# Patient Record
Sex: Male | Born: 1960 | Race: Black or African American | Hispanic: No | Marital: Single | Smoking: Never smoker
Health system: Southern US, Community
[De-identification: ages and names within clinical notes are randomized; demographics above are authoritative.]

## PROBLEM LIST (undated history)

## (undated) HISTORY — PX: CYSTECTOMY: SUR359

---

## 2008-09-08 LAB — HM COLONOSCOPY: HM Colonoscopy: NORMAL

## 2011-05-15 ENCOUNTER — Emergency Department (INDEPENDENT_AMBULATORY_CARE_PROVIDER_SITE_OTHER): Payer: No Typology Code available for payment source

## 2011-05-15 ENCOUNTER — Emergency Department (HOSPITAL_BASED_OUTPATIENT_CLINIC_OR_DEPARTMENT_OTHER)
Admission: EM | Admit: 2011-05-15 | Discharge: 2011-05-15 | Disposition: A | Discharge status: Home / Self Care | Payer: No Typology Code available for payment source | Attending: Emergency Medicine | Admitting: Emergency Medicine

## 2011-05-15 ENCOUNTER — Encounter: Payer: Self-pay | Admitting: Family Medicine

## 2011-05-15 DIAGNOSIS — M5137 Other intervertebral disc degeneration, lumbosacral region: Secondary | ICD-10-CM

## 2011-05-15 DIAGNOSIS — Y92410 Unspecified street and highway as the place of occurrence of the external cause: Secondary | ICD-10-CM | POA: Insufficient documentation

## 2011-05-15 DIAGNOSIS — S025XXA Fracture of tooth (traumatic), initial encounter for closed fracture: Secondary | ICD-10-CM

## 2011-05-15 DIAGNOSIS — M549 Dorsalgia, unspecified: Secondary | ICD-10-CM

## 2011-05-15 DIAGNOSIS — M47812 Spondylosis without myelopathy or radiculopathy, cervical region: Secondary | ICD-10-CM

## 2011-05-15 DIAGNOSIS — M542 Cervicalgia: Secondary | ICD-10-CM

## 2011-05-15 DIAGNOSIS — M51379 Other intervertebral disc degeneration, lumbosacral region without mention of lumbar back pain or lower extremity pain: Secondary | ICD-10-CM | POA: Insufficient documentation

## 2011-05-15 MED ORDER — HYDROCODONE-ACETAMINOPHEN 5-500 MG PO TABS: 1.0000 | ORAL_TABLET | Freq: Four times a day (QID) | ORAL | Status: AC | PRN

## 2011-05-15 MED ORDER — IBUPROFEN 600 MG PO TABS
600.0000 mg | ORAL_TABLET | Freq: Four times a day (QID) | ORAL | Status: AC | PRN
Start: 2011-05-15 — End: 2011-05-25

## 2011-05-15 MED ORDER — IBUPROFEN 800 MG PO TABS
800.0000 mg | ORAL_TABLET | Freq: Once | ORAL | Status: AC
  Administered 2011-05-15: 800 mg via ORAL
  Filled 2011-05-15: qty 1

## 2011-05-15 MED ORDER — CYCLOBENZAPRINE HCL 10 MG PO TABS: 10.0000 mg | ORAL_TABLET | Freq: Every evening | ORAL | Status: AC | PRN
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
## 2011-05-28 ENCOUNTER — Emergency Department (HOSPITAL_BASED_OUTPATIENT_CLINIC_OR_DEPARTMENT_OTHER)
Admission: EM | Admit: 2011-05-28 | Discharge: 2011-05-28 | Disposition: A | Discharge status: Home / Self Care | Payer: No Typology Code available for payment source | Attending: Emergency Medicine | Admitting: Emergency Medicine

## 2011-05-28 ENCOUNTER — Emergency Department (INDEPENDENT_AMBULATORY_CARE_PROVIDER_SITE_OTHER): Payer: No Typology Code available for payment source

## 2011-05-28 ENCOUNTER — Encounter (HOSPITAL_BASED_OUTPATIENT_CLINIC_OR_DEPARTMENT_OTHER): Payer: Self-pay | Admitting: Emergency Medicine

## 2011-05-28 DIAGNOSIS — M549 Dorsalgia, unspecified: Secondary | ICD-10-CM

## 2011-05-28 DIAGNOSIS — M542 Cervicalgia: Secondary | ICD-10-CM | POA: Insufficient documentation

## 2011-05-28 DIAGNOSIS — K029 Dental caries, unspecified: Secondary | ICD-10-CM | POA: Insufficient documentation

## 2011-05-28 DIAGNOSIS — M545 Low back pain, unspecified: Secondary | ICD-10-CM | POA: Insufficient documentation

## 2011-05-28 DIAGNOSIS — IMO0001 Reserved for inherently not codable concepts without codable children: Secondary | ICD-10-CM | POA: Insufficient documentation

## 2011-05-28 DIAGNOSIS — M7918 Myalgia, other site: Secondary | ICD-10-CM

## 2011-05-28 MED ORDER — HYDROCODONE-ACETAMINOPHEN 5-325 MG PO TABS: 2.0000 | ORAL_TABLET | Freq: Four times a day (QID) | ORAL | Status: AC | PRN

## 2011-05-28 MED ORDER — CYCLOBENZAPRINE HCL 10 MG PO TABS: 10.0000 mg | ORAL_TABLET | Freq: Three times a day (TID) | ORAL | Status: AC | PRN

## 2011-05-28 MED ORDER — IBUPROFEN 800 MG PO TABS
800.0000 mg | ORAL_TABLET | Freq: Once | ORAL | Status: AC
  Administered 2011-05-28: 800 mg via ORAL
  Filled 2011-05-28: qty 1
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
## 2011-06-11 ENCOUNTER — Encounter: Payer: Self-pay | Admitting: Internal Medicine

## 2011-06-11 ENCOUNTER — Ambulatory Visit (INDEPENDENT_AMBULATORY_CARE_PROVIDER_SITE_OTHER): Payer: Self-pay | Admitting: Internal Medicine

## 2011-06-11 VITALS — BP 122/80 | HR 73 | Temp 98.0°F | Resp 20 | Ht 75.0 in | Wt 205.0 lb

## 2011-06-11 DIAGNOSIS — M542 Cervicalgia: Secondary | ICD-10-CM

## 2011-06-11 DIAGNOSIS — G44209 Tension-type headache, unspecified, not intractable: Secondary | ICD-10-CM

## 2011-06-11 DIAGNOSIS — M549 Dorsalgia, unspecified: Secondary | ICD-10-CM

## 2011-06-11 MED ORDER — PREDNISONE 10 MG PO TABS: ORAL_TABLET | ORAL | Status: DC

## 2011-06-11 MED ORDER — CYCLOBENZAPRINE HCL 10 MG PO TABS: 10.0000 mg | ORAL_TABLET | Freq: Three times a day (TID) | ORAL | Status: DC | PRN

## 2011-06-13 DIAGNOSIS — G44209 Tension-type headache, unspecified, not intractable: Secondary | ICD-10-CM | POA: Insufficient documentation

## 2011-06-13 DIAGNOSIS — M542 Cervicalgia: Secondary | ICD-10-CM | POA: Insufficient documentation

## 2011-06-13 DIAGNOSIS — M549 Dorsalgia, unspecified: Secondary | ICD-10-CM | POA: Insufficient documentation

## 2011-06-13 NOTE — Assessment & Plan Note (Signed)
Attempt prednisone taper. PT as above

## 2011-06-13 NOTE — Assessment & Plan Note (Signed)
Attempt ibuprofen with food and no other nsaids. rf flexeril and cautioned re possible sedating effect. Wean off vicodin. Schedule PT
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
## 2011-06-24 ENCOUNTER — Ambulatory Visit: Payer: No Typology Code available for payment source | Attending: Internal Medicine | Admitting: Physical Therapy

## 2011-06-24 DIAGNOSIS — IMO0001 Reserved for inherently not codable concepts without codable children: Secondary | ICD-10-CM | POA: Insufficient documentation

## 2011-06-24 DIAGNOSIS — M542 Cervicalgia: Secondary | ICD-10-CM | POA: Insufficient documentation

## 2011-06-24 DIAGNOSIS — M545 Low back pain, unspecified: Secondary | ICD-10-CM | POA: Insufficient documentation

## 2011-07-03 ENCOUNTER — Ambulatory Visit: Payer: No Typology Code available for payment source | Attending: Internal Medicine | Admitting: Physical Therapy

## 2011-07-03 DIAGNOSIS — M542 Cervicalgia: Secondary | ICD-10-CM | POA: Insufficient documentation

## 2011-07-03 DIAGNOSIS — M545 Low back pain, unspecified: Secondary | ICD-10-CM | POA: Insufficient documentation

## 2011-07-03 DIAGNOSIS — IMO0001 Reserved for inherently not codable concepts without codable children: Secondary | ICD-10-CM | POA: Insufficient documentation

## 2011-07-17 ENCOUNTER — Ambulatory Visit: Payer: No Typology Code available for payment source | Admitting: Physical Therapy

## 2011-07-20 ENCOUNTER — Encounter: Payer: Self-pay | Admitting: Internal Medicine

## 2011-07-20 ENCOUNTER — Ambulatory Visit (INDEPENDENT_AMBULATORY_CARE_PROVIDER_SITE_OTHER): Payer: No Typology Code available for payment source | Admitting: Internal Medicine

## 2011-07-20 DIAGNOSIS — M549 Dorsalgia, unspecified: Secondary | ICD-10-CM

## 2011-07-20 DIAGNOSIS — M542 Cervicalgia: Secondary | ICD-10-CM

## 2011-07-20 MED ORDER — PREDNISONE 10 MG PO TABS: ORAL_TABLET | ORAL | Status: AC

## 2011-07-25 NOTE — Assessment & Plan Note (Signed)
reattempt prednisone taper. Consider ls mri if sx's persist.
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
## 2011-07-25 NOTE — Assessment & Plan Note (Signed)
Improving. Continue pt.

## 2011-07-31 ENCOUNTER — Ambulatory Visit: Payer: No Typology Code available for payment source | Admitting: Physical Therapy

## 2011-08-14 ENCOUNTER — Ambulatory Visit: Payer: No Typology Code available for payment source | Attending: Internal Medicine | Admitting: Physical Therapy

## 2011-08-14 DIAGNOSIS — M542 Cervicalgia: Secondary | ICD-10-CM | POA: Insufficient documentation

## 2011-08-14 DIAGNOSIS — IMO0001 Reserved for inherently not codable concepts without codable children: Secondary | ICD-10-CM | POA: Insufficient documentation

## 2011-08-14 DIAGNOSIS — M545 Low back pain, unspecified: Secondary | ICD-10-CM | POA: Insufficient documentation

## 2011-08-31 ENCOUNTER — Encounter: Payer: Self-pay | Admitting: Internal Medicine

## 2011-08-31 ENCOUNTER — Ambulatory Visit (INDEPENDENT_AMBULATORY_CARE_PROVIDER_SITE_OTHER): Payer: No Typology Code available for payment source | Admitting: Internal Medicine

## 2011-08-31 VITALS — BP 132/80 | HR 62 | Temp 97.9°F | Resp 18 | Ht 75.0 in | Wt 199.0 lb

## 2011-08-31 DIAGNOSIS — M549 Dorsalgia, unspecified: Secondary | ICD-10-CM

## 2011-08-31 MED ORDER — CYCLOBENZAPRINE HCL 10 MG PO TABS: 10.0000 mg | ORAL_TABLET | Freq: Three times a day (TID) | ORAL | Status: DC | PRN

## 2011-09-04 NOTE — Assessment & Plan Note (Signed)
Neurologically nonfocal. Continue PT. Rf flexeril for prn use. Discussed LS MRI for further evaluation and pt currently defers. Schedule f/u for re-evaluation.
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
## 2011-09-21 ENCOUNTER — Ambulatory Visit: Payer: No Typology Code available for payment source | Attending: Internal Medicine | Admitting: Physical Therapy

## 2011-09-21 DIAGNOSIS — M545 Low back pain, unspecified: Secondary | ICD-10-CM | POA: Insufficient documentation

## 2011-09-21 DIAGNOSIS — IMO0001 Reserved for inherently not codable concepts without codable children: Secondary | ICD-10-CM | POA: Insufficient documentation

## 2011-09-21 DIAGNOSIS — M542 Cervicalgia: Secondary | ICD-10-CM | POA: Insufficient documentation

## 2011-09-25 ENCOUNTER — Ambulatory Visit: Payer: No Typology Code available for payment source | Attending: Internal Medicine | Admitting: Physical Therapy

## 2011-09-25 DIAGNOSIS — IMO0001 Reserved for inherently not codable concepts without codable children: Secondary | ICD-10-CM | POA: Insufficient documentation

## 2011-09-25 DIAGNOSIS — M545 Low back pain, unspecified: Secondary | ICD-10-CM | POA: Insufficient documentation

## 2011-09-25 DIAGNOSIS — M542 Cervicalgia: Secondary | ICD-10-CM | POA: Insufficient documentation

## 2011-09-28 ENCOUNTER — Encounter: Payer: No Typology Code available for payment source | Admitting: Physical Therapy

## 2011-10-05 ENCOUNTER — Encounter: Payer: No Typology Code available for payment source | Admitting: Physical Therapy

## 2011-10-09 ENCOUNTER — Encounter: Payer: No Typology Code available for payment source | Admitting: Physical Therapy

## 2011-10-12 ENCOUNTER — Encounter: Payer: No Typology Code available for payment source | Admitting: Physical Therapy

## 2011-10-16 ENCOUNTER — Ambulatory Visit (INDEPENDENT_AMBULATORY_CARE_PROVIDER_SITE_OTHER): Payer: No Typology Code available for payment source | Admitting: Internal Medicine

## 2011-10-16 ENCOUNTER — Encounter: Payer: Self-pay | Admitting: Internal Medicine

## 2011-10-16 VITALS — BP 124/80 | HR 68 | Temp 98.1°F | Resp 18 | Wt 198.0 lb

## 2011-10-16 DIAGNOSIS — M549 Dorsalgia, unspecified: Secondary | ICD-10-CM

## 2011-10-16 MED ORDER — CYCLOBENZAPRINE HCL 10 MG PO TABS: 10.0000 mg | ORAL_TABLET | Freq: Three times a day (TID) | ORAL | Status: DC | PRN

## 2011-10-21 NOTE — Assessment & Plan Note (Signed)
Failing conservative care. RF flexeril for prn use. Recommend LS MRI for further evaluation. States will consider. Advised to seek immediate medical attention if develops focal weakness and/or incontinence.

## 2011-10-21 NOTE — Progress Notes (Signed)
  Subjective:    Patient ID: Edward Higgins, male    DOB: 1961-03-22, 51 y.o.   MRN: [id number]  HPI Pt presents to clinic for followup of multiple medical problems. Has noted no further improvement of low back pain. Continues to notes radicular pain down left leg without paresthesia or weakness. S/p steroid taper x 2 and PT. States flexeril helps some. No other alleviating factors.   No past medical history on file. Past Surgical History  Procedure Date  . Cystectomy     mid back    reports that he has never smoked. He has never used smokeless tobacco. He reports that he does not drink alcohol or use illicit drugs. family history is negative for Breast cancer, and Prostate cancer, and Hypertension, and Heart disease, and Diabetes, and Colon cancer, . No Known Allergies    Review of Systems see hpi     Objective:   Physical Exam  Nursing note and vitals reviewed. Constitutional: He appears well-developed and well-nourished. No distress.  HENT:  Head: Normocephalic and atraumatic.  Musculoskeletal:       Bilateral LE strength 5/5. Gait nl.  Neurological: He is alert.  Skin: Skin is warm and dry. He is not diaphoretic.  Psychiatric: He has a normal mood and affect.          Assessment & Plan:

## 2011-10-23 ENCOUNTER — Encounter: Payer: No Typology Code available for payment source | Admitting: Physical Therapy

## 2011-12-18 ENCOUNTER — Ambulatory Visit: Payer: No Typology Code available for payment source | Admitting: Internal Medicine

## 2012-01-22 ENCOUNTER — Ambulatory Visit: Payer: No Typology Code available for payment source | Admitting: Internal Medicine

## 2012-01-29 ENCOUNTER — Ambulatory Visit: Payer: Self-pay | Admitting: Internal Medicine

## 2012-01-29 DIAGNOSIS — Z0289 Encounter for other administrative examinations: Secondary | ICD-10-CM

## 2015-04-15 ENCOUNTER — Emergency Department (HOSPITAL_BASED_OUTPATIENT_CLINIC_OR_DEPARTMENT_OTHER)
Admission: EM | Admit: 2015-04-15 | Discharge: 2015-04-15 | Disposition: A | Discharge status: Home / Self Care | Payer: No Typology Code available for payment source | Attending: Emergency Medicine | Admitting: Emergency Medicine

## 2015-04-15 ENCOUNTER — Encounter (HOSPITAL_BASED_OUTPATIENT_CLINIC_OR_DEPARTMENT_OTHER): Payer: Self-pay | Admitting: Emergency Medicine

## 2015-04-15 DIAGNOSIS — S29002A Unspecified injury of muscle and tendon of back wall of thorax, initial encounter: Secondary | ICD-10-CM | POA: Insufficient documentation

## 2015-04-15 DIAGNOSIS — Y998 Other external cause status: Secondary | ICD-10-CM | POA: Diagnosis not present

## 2015-04-15 DIAGNOSIS — Y92410 Unspecified street and highway as the place of occurrence of the external cause: Secondary | ICD-10-CM | POA: Diagnosis not present

## 2015-04-15 DIAGNOSIS — Y9389 Activity, other specified: Secondary | ICD-10-CM | POA: Insufficient documentation

## 2015-04-15 DIAGNOSIS — M62838 Other muscle spasm: Secondary | ICD-10-CM | POA: Insufficient documentation

## 2015-04-15 DIAGNOSIS — S199XXA Unspecified injury of neck, initial encounter: Secondary | ICD-10-CM | POA: Diagnosis present

## 2015-04-15 MED ORDER — DIAZEPAM 5 MG PO TABS: 5.0000 mg | ORAL_TABLET | Freq: Four times a day (QID) | ORAL | Status: DC | PRN

## 2015-04-15 MED ORDER — DIAZEPAM 5 MG PO TABS: 5.0000 mg | ORAL_TABLET | Freq: Once | ORAL | Status: DC

## 2015-04-15 MED ORDER — MELOXICAM 7.5 MG PO TABS: 7.5000 mg | ORAL_TABLET | Freq: Every day | ORAL | Status: DC
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
## 2015-04-16 ENCOUNTER — Ambulatory Visit (HOSPITAL_BASED_OUTPATIENT_CLINIC_OR_DEPARTMENT_OTHER)
Admission: RE | Admit: 2015-04-16 | Discharge: 2015-04-16 | Disposition: A | Discharge status: Home / Self Care | Payer: No Typology Code available for payment source | Source: Ambulatory Visit | Attending: Family Medicine | Admitting: Family Medicine

## 2015-04-16 ENCOUNTER — Encounter: Payer: Self-pay | Admitting: Family Medicine

## 2015-04-16 ENCOUNTER — Ambulatory Visit (INDEPENDENT_AMBULATORY_CARE_PROVIDER_SITE_OTHER): Payer: Self-pay | Admitting: Family Medicine

## 2015-04-16 VITALS — BP 126/84 | HR 68 | Ht 74.0 in | Wt 185.0 lb

## 2015-04-16 DIAGNOSIS — M542 Cervicalgia: Secondary | ICD-10-CM | POA: Insufficient documentation

## 2015-04-16 DIAGNOSIS — S199XXA Unspecified injury of neck, initial encounter: Secondary | ICD-10-CM

## 2015-04-16 MED ORDER — PREDNISONE 10 MG PO TABS: ORAL_TABLET | ORAL | Status: DC

## 2015-04-16 MED ORDER — HYDROCODONE-ACETAMINOPHEN 5-325 MG PO TABS: 1.0000 | ORAL_TABLET | Freq: Four times a day (QID) | ORAL | Status: DC | PRN

## 2015-04-16 MED ORDER — DIAZEPAM 5 MG PO TABS: 5.0000 mg | ORAL_TABLET | Freq: Three times a day (TID) | ORAL | Status: DC | PRN

## 2015-04-16 NOTE — Patient Instructions (Signed)
You have cervical radiculopathy (a pinched nerve in the neck). We will go ahead with an MRI given your weakness, severe pain on the right side to look for a disc herniation. Prednisone 6 day dose pack to relieve irritation/inflammation of the nerve. Aleve 2 tabs twice a day with food for pain and inflammation - start day AFTER finishing prednisone if prescribed this. Valium three times a day as needed for muscle spasms (no driving while taking this). Norco as needed for severe pain (no driving on this medicine). Consider cervical collar if severely painful. Simple range of motion exercises within limits of pain to prevent further stiffness. Consider physical therapy for stretching, exercises, traction, and modalities in the future. Heat 15 minutes at a time 3-4 times a day to help with spasms. Watch head position when on computers, texting, when sleeping in bed - should in line with back to prevent further nerve traction and irritation. I will call you with MRI results and next steps.

## 2015-04-22 ENCOUNTER — Ambulatory Visit (INDEPENDENT_AMBULATORY_CARE_PROVIDER_SITE_OTHER): Payer: Self-pay

## 2015-04-22 ENCOUNTER — Telehealth: Payer: Self-pay | Admitting: Family Medicine

## 2015-04-22 ENCOUNTER — Ambulatory Visit: Payer: Self-pay | Admitting: Family Medicine

## 2015-04-22 DIAGNOSIS — S199XXA Unspecified injury of neck, initial encounter: Secondary | ICD-10-CM

## 2015-04-22 DIAGNOSIS — M47892 Other spondylosis, cervical region: Secondary | ICD-10-CM

## 2015-04-22 NOTE — Telephone Encounter (Signed)
Patient was given instructions regarding valium prior to MRI - is going to reattempt today.

## 2015-04-23 DIAGNOSIS — S199XXA Unspecified injury of neck, initial encounter: Secondary | ICD-10-CM | POA: Insufficient documentation
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
## 2015-04-24 NOTE — Addendum Note (Signed)
Addended by: Kathi SimpersWISE, Naaman Curro F on: 04/24/2015 10:42 AM   Modules accepted: Orders

## 2015-04-25 ENCOUNTER — Ambulatory Visit: Payer: No Typology Code available for payment source | Attending: Family Medicine | Admitting: Physical Therapy

## 2015-04-25 DIAGNOSIS — M436 Torticollis: Secondary | ICD-10-CM | POA: Diagnosis present

## 2015-04-25 DIAGNOSIS — M542 Cervicalgia: Secondary | ICD-10-CM | POA: Insufficient documentation
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
## 2015-04-26 ENCOUNTER — Ambulatory Visit: Payer: No Typology Code available for payment source | Admitting: Physical Therapy

## 2015-04-26 DIAGNOSIS — M542 Cervicalgia: Secondary | ICD-10-CM

## 2015-04-26 DIAGNOSIS — M436 Torticollis: Secondary | ICD-10-CM

## 2015-04-26 NOTE — Therapy (Signed)
[facility] [address]  [address] [place], [place], [address] Phone: [phone]   Fax:  [phone]  Physical Therapy Treatment  Patient Details  Name: Edward Higgins MRN: [id number] Date of Birth: Nov 29, 1960 Referring Provider: Lenda KelpShane R Hudnall, MD  Encounter Date: 04/26/2015      PT End of Session - 04/26/15 1138    Visit Number 2   Number of Visits 12   Date for PT Re-Evaluation 06/06/15   PT Start Time 0845   PT Stop Time 0940   PT Time Calculation (min) 55 min   Activity Tolerance Patient limited by pain   Behavior During Therapy Restless      No past medical history on file.  Past Surgical History  Procedure Laterality Date   Cystectomy      mid back    There were no vitals filed for this visit.  Visit Diagnosis:  Neck pain  Neck stiffness      Subjective Assessment - 04/26/15 1133    Subjective Cont with significant Right> Left neck and shoulder pain and stiffness.  Difficulty sleeping d/t intolerance to positioning.     Currently in Pain? Yes   Pain Score 6    Pain Location Neck   Pain Orientation Right;Lateral   Pain Descriptors / Indicators Tightness;Throbbing   Pain Type Acute pain   Pain Radiating Towards neck/ upper shoulder and upper arm            [facility] PT Assessment - 04/25/15 1020    Assessment   Medical Diagnosis Right neck/shoulder pain   Referring Provider Lenda KelpShane R Hudnall, MD   Onset Date/Surgical Date 04/13/15   Next MD Visit ~2 weeks from 11/29   Prior Therapy none   Balance Screen   Has the patient fallen in the past 6 months No   Has the patient had a decrease in activity level because of a fear of falling?  No   Is the patient reluctant to leave their home because of a fear of falling?  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   Observation/Other Assessments   Focus  on Therapeutic Outcomes (FOTO)  Neck 32% (68% limitation); Predicted 63% (37% limitation)   Posture/Postural Control   Posture/Postural Control Postural limitations   Postural Limitations Rounded Shoulders;Forward head   Posture Comments Slouched posture with head held stiffly in left lateral tilt   ROM / Strength   AROM / PROM / Strength AROM;Strength   AROM   Overall AROM Comments Pain with all attempted movement of neck/shoulders   AROM Assessment Site Cervical;Shoulder   Right/Left Shoulder Right;Left   Right Shoulder Flexion 72 Degrees   Right Shoulder ABduction --  unable   Left Shoulder Flexion 88 Degrees   Left Shoulder ABduction 92 Degrees   Cervical Flexion unable   Cervical Extension unable   Cervical - Right Side Bend unable   Cervical - Left Side Bend unable   Cervical - Right Rotation 19   Cervical - Left Rotation 16   Palpation   Palpation comment very ttp along bilateral paraspinals R>L, Rt upper trap, levator and scalenes                     [facility] Adult PT Treatment/Exercise - 04/26/15 0001    Exercises   Exercises Neck;Shoulder   Neck Exercises: Seated  Cervical Rotation Both;5 reps   Cervical Rotation Limitations minimal ROM with increased pain bilaterally   Lateral Flexion Both;5 reps   Lateral Flexion Limitations minimal ROM with increased pain bilaterally   Modalities   Modalities Ultrasound;Moist Heat;Electrical Stimulation   Moist Heat Therapy   Number Minutes Moist Heat 15 Minutes   Moist Heat Location Cervical;Shoulder   Electrical Stimulation   Electrical Stimulation Location Right C/S upper trap   Electrical Stimulation Parameters premod 80-150Hz     Electrical Stimulation Goals Pain   Ultrasound   Ultrasound Location Right C/S, upper trap   Ultrasound Parameters 100% @ 1.2w/cm2   Ultrasound Goals Pain   Manual Therapy   Manual Therapy Soft tissue mobilization;Passive ROM   Manual therapy comments hyper tonic throughout.   TTP throughout   Soft tissue mobilization Unable to assume supine d/t increased guarding and pain   Passive ROM minimal C/S ROM throughout                PT Education - 04/25/15 2005    Education provided Yes   Education Details Pain management - estim   Person(s) Educated Patient   Methods Explanation;Demonstration   Comprehension Verbalized understanding          PT Short Term Goals - 04/25/15 2028    PT SHORT TERM GOAL #1   Title Pt independent with initial HEP (05/09/15)   Time 2   Period Weeks   Status New           PT Long Term Goals - 04/25/15 2028    PT LONG TERM GOAL #1   Title Pt independent with advanced HEP vs gym program (06/06/15)   Time 6   Period Weeks   Status New   PT LONG TERM GOAL #2   Title Pt will demonstrate C-spine B rotation to 50 degrees or greater, B side-bending to 30 degrees, Flexion & Extension to 50 degrees or greater (06/06/15)   Time 6   Period Weeks   Status New   PT LONG TERM GOAL #3   Title Pt will demonstrate bilateral shoulder ROM WFL (06/06/15)   Time 6   Period Weeks   Status New   PT LONG TERM GOAL #4   Title Pt reports able to sleep without restriction by neck pain (06/06/15)   Time 6   Period Weeks   Status New   PT LONG TERM GOAL #5   Title Pt reports able to perform all chores, ADLs, and work/volunteer duties without restriction by neck pain (06/06/15)   Time 6   Period Weeks   Status New               Plan - 04/26/15 1139    Clinical Impression Statement Poor tolerance in general d/t hyper tonic musculature throughout Right C/S and upper trap.  Significant ROM limitations d/t guarding and pain.  Multiple areas of tigger point throughout Right upper trap, levator scap, and C/S.  Did not report any N/T.  Reports feeling benefit from electric stimulation with MHP.  Only tolerated very minimal intensity with Electric stimulation.  Did encourage him to purchase a soft collar as this may help to decreased the  amount of muscle strain he is achieving d/t guarding and forward head posture.     Pt will benefit from skilled therapeutic intervention in order to improve on the following deficits Pain;Hypomobility;Impaired flexibility;Decreased range of motion;Decreased strength;Postural dysfunction   Rehab Potential Good   PT Next Visit Plan manual  and modalities PRN for pain and ROM, ? traction, postural education and training        Problem List Patient Active Problem List   Diagnosis Date Noted   Neck injury 04/23/2015   Neck pain 06/13/2011   Back pain 06/13/2011   Tension headache 06/13/2011    Tomie China, PTA 04/26/2015, 11:45 AM  [facility] [address]  [address] [place], [place], [address] Phone: [phone]   Fax:  [phone]  Name: Edward Higgins MRN: [id number] Date of Birth: [birth year]

## 2015-04-26 NOTE — Addendum Note (Signed)
Addended by: Lenda KelpHUDNALL, Alisha Bacus R on: 04/26/2015 08:31 AM   Modules accepted: Kipp BroodSmartSet

## 2015-05-03 ENCOUNTER — Ambulatory Visit: Payer: No Typology Code available for payment source | Admitting: Physical Therapy

## 2015-05-03 DIAGNOSIS — M436 Torticollis: Secondary | ICD-10-CM

## 2015-05-03 DIAGNOSIS — M542 Cervicalgia: Secondary | ICD-10-CM | POA: Diagnosis not present

## 2015-05-03 NOTE — Therapy (Signed)
[facility] [address]  [address] [place], [place], [address] Phone: [phone]   Fax:  [phone]  Physical Therapy Treatment  Patient Details  Name: Edward Higgins MRN: [id number] Date of Birth: 03-23-1961 Referring Provider: Lenda Kelp, MD  Encounter Date: 05/03/2015      PT End of Session - 05/03/15 0938    Visit Number 3   Number of Visits 12   Date for PT Re-Evaluation 06/06/15   PT Start Time 0850   PT Stop Time 0945   PT Time Calculation (min) 55 min   Activity Tolerance Patient tolerated treatment well   Behavior During Therapy Restless;Anxious      No past medical history on file.  Past Surgical History  Procedure Laterality Date   Cystectomy      mid back    There were no vitals filed for this visit.  Visit Diagnosis:  Neck pain  Neck stiffness      Subjective Assessment - 05/03/15 0933    Subjective Reports ongoing C/S pain, tightness, and decreased ROM.  Reports that he has been using a soft collar as encouraged last visit.  States that he cont to have "nerve pain:" Right C/S and upper trap.  Sleeping poorly d/t increased pain.  Fearful of pain with ROM.  States that this morning he is feeling particularly bad, and does not want to have any soft tissue or PROM work done.  Reperts that heat has been making him feel better, and really just wants heat and E-stim today.     Currently in Pain? Yes   Pain Score 6    Pain Location Neck   Pain Orientation Right;Lateral   Pain Descriptors / Indicators Tightness;Throbbing   Pain Type Acute pain   Pain Frequency Constant   Pain Relieving Factors heat                         [facility] Adult PT Treatment/Exercise - 05/03/15 0001    Exercises   Exercises --   Neck Exercises: Standing   Neck Retraction 10 reps;3 secs   Neck Retraction Limitations in sitting.  limited ROM d/t pain   Neck Exercises: Seated   Neck Retraction 10  reps;3 secs   Neck Retraction Limitations minimal ROM d/t increased pain and guarding   Cervical Rotation Both;10 reps   Cervical Rotation Limitations minimal ROM with increased pain bilaterally   Lateral Flexion Both;10 reps   Lateral Flexion Limitations minimal ROM with increased pain bilaterally   Other Seated Exercise C/S extension/ Flexion 10x5" ea   Other Seated Exercise T/ rotation 10x5" ea B   Shoulder Exercises: Seated   Retraction AROM;Both;10 reps   Other Seated Exercises Shoulder shrugs 10x 5"   Modalities   Modalities Ultrasound;Moist Heat;Electrical Stimulation   Moist Heat Therapy   Number Minutes Moist Heat 15 Minutes   Moist Heat Location Cervical;Shoulder   Electrical Stimulation   Electrical Stimulation Location Right C/S upper trap   Electrical Stimulation Parameters premod 80-150Hz    Electrical Stimulation Goals Pain   Ultrasound   Ultrasound Location Right C/S, upper trap   Ultrasound Parameters 100% , 1.2w/cm2   Ultrasound Goals Pain                  PT Short Term Goals - 04/25/15 2028    PT SHORT TERM GOAL #1   Title Pt independent with initial HEP (05/09/15)   Time  2   Period Weeks   Status New           PT Long Term Goals - 04/25/15 2028    PT LONG TERM GOAL #1   Title Pt independent with advanced HEP vs gym program (06/06/15)   Time 6   Period Weeks   Status New   PT LONG TERM GOAL #2   Title Pt will demonstrate C-spine B rotation to 50 degrees or greater, B side-bending to 30 degrees, Flexion & Extension to 50 degrees or greater (06/06/15)   Time 6   Period Weeks   Status New   PT LONG TERM GOAL #3   Title Pt will demonstrate bilateral shoulder ROM WFL (06/06/15)   Time 6   Period Weeks   Status New   PT LONG TERM GOAL #4   Title Pt reports able to sleep without restriction by neck pain (06/06/15)   Time 6   Period Weeks   Status New   PT LONG TERM GOAL #5   Title Pt reports able to perform all chores, ADLs, and  work/volunteer duties without restriction by neck pain (06/06/15)   Time 6   Period Weeks   Status New               Plan - 05/03/15 1017    Clinical Impression Statement Again with poor tolerance in general today d/t pain and apprehension.  Minimal ROM and willingness to progress ROM d/t pain.  Limited treatment to AROM and self guided ROM d/t request to start session.  Did note that he did not have increased muscle tone throughout his upper trap and Right C/S as was expected.  Was able to assess this while performing ultrasound.  Did demo increased C/S rotation ROM while performing T/S rotation vs C/S rot.  Observed in the waiting area with Right lateral flexion of C/S, though was not able to illicit this amount of ROM during AROM exercises.     Pt will benefit from skilled therapeutic intervention in order to improve on the following deficits Pain;Hypomobility;Impaired flexibility;Decreased range of motion;Decreased strength;Postural dysfunction        Problem List Patient Active Problem List   Diagnosis Date Noted   Neck injury 04/23/2015   Neck pain 06/13/2011   Back pain 06/13/2011   Tension headache 06/13/2011    Tomie ChinaLarry C Clements, PTA 05/03/2015, 10:27 AM  [facility] [address]  [address] [place], [place], [address] Phone: [phone]   Fax:  [phone]  Name: Edward Higgins MRN: [id number] Date of Birth: 1960/11/03

## 2015-05-07 ENCOUNTER — Ambulatory Visit: Payer: No Typology Code available for payment source | Admitting: Physical Therapy

## 2015-05-07 DIAGNOSIS — M436 Torticollis: Secondary | ICD-10-CM

## 2015-05-07 DIAGNOSIS — M542 Cervicalgia: Secondary | ICD-10-CM

## 2015-05-07 NOTE — Therapy (Signed)
[facility] [address]  [address] [place], [place], [address] Phone: [phone]   Fax:  [phone]  Physical Therapy Treatment  Patient Details  Name: Edward Higgins MRN: [id number] Date of Birth: 08/09/1960 Referring Provider: Lenda Kelp, MD  Encounter Date: 05/07/2015      PT End of Session - 05/07/15 1123    Visit Number 4   Number of Visits 12   Date for PT Re-Evaluation 06/06/15   PT Start Time 1019   PT Stop Time 1114   PT Time Calculation (min) 55 min   Activity Tolerance Patient tolerated treatment well;Patient limited by pain   Behavior During Therapy Anxious      No past medical history on file.  Past Surgical History  Procedure Laterality Date  . Cystectomy      mid back    There were no vitals filed for this visit.  Visit Diagnosis:  Neck pain  Neck stiffness      Subjective Assessment - 05/07/15 1022    Subjective Reports feeling like neck is improving but continues to c/o pain and stiffness and presents with significant guarding with left shoulder elevated and head held in left sidebending and rotation with left hand rubbing right side of neck. States using soft collar to sleep but otherwise typically does not wear collar. Not taking pain meds as much due to c/o undesirable aftertaste. Continues to report benefit from heating pad and uses it frequently during the day.   Currently in Pain? Yes   Pain Score --  5.5/10   Pain Location Neck   Pain Orientation Right;Lateral          Today's Treatment  Pre-mod estim (10-20Hz ) to right C/S and upper trap x10" with moist heat to promote decreased pain and muscle relaxation in prep for exercises  Manual Supine Gentle C/S distraction with slight flexion  Supine Gentle PROM of C/S in all directions to patient tolerance  TherEx Supine chin tuck with hand towel roll under neck 5x3" Supine Cervical B sidebending and B rotation  isometrics 5x3" each Supine Contract/relax for shoulder depression x5 Seated in front of mirror:   Cervical retraction 5x3"   Supported cervical extension with hand towel at lower C/S 5x3"   B cervical rotation 5x3" each   B cervical sidebending 5x3"   Shoulder shrugs x10 (emphasis on symmetrical movement R vs L)   Shoulder rolls Fwd/Back x10 (emphasis on symmetrical movement R vs L)   Scapular retraction 10x3" (emphasis on symmetrical movement R vs L)            PT Education - 05/07/15 1122    Education provided Yes   Education Details Postural awareness; Initial HEP (patient instructed to perform exercises in front of mirror to avoid substitution and ensure symmetrical movement)   Person(s) Educated Patient   Methods Explanation;Demonstration;Handout   Comprehension Verbalized understanding;Returned demonstration;Verbal cues required;Tactile cues required;Need further instruction          PT Short Term Goals - 05/07/15 1140    PT SHORT TERM GOAL #1   Title Pt independent with initial HEP (05/09/15)   Status On-going           PT Long Term Goals - 05/07/15 1140    PT LONG TERM GOAL #1   Title Pt independent with advanced HEP vs gym program (06/06/15)   Status On-going   PT LONG TERM GOAL #2   Title Pt will demonstrate C-spine  B rotation to 50 degrees or greater, B side-bending to 30 degrees, Flexion & Extension to 50 degrees or greater (06/06/15)   Status On-going   PT LONG TERM GOAL #3   Title Pt will demonstrate bilateral shoulder ROM WFL (06/06/15)   Status On-going   PT LONG TERM GOAL #4   Title Pt reports able to sleep without restriction by neck pain (06/06/15)   Status On-going   PT LONG TERM GOAL #5   Title Pt reports able to perform all chores, ADLs, and work/volunteer duties without restriction by neck pain (06/06/15)   Status On-going               Plan - 05/07/15 1125    Clinical Impression Statement Patient persists with poor posture and  guarded positioning of neck in left sidebending and rotation. Educated patient on need to gradually increase ROM and mobility in cervical spine therefore utilized estim and moist heat to promote muscle relaxation and decrease guarding before initiation of gentle ROM stretching and isometrics. Pain behavior remains exagerrated with patient "jumping" from pain when PT switched channels on estim before stim for 2nd channel turned on. Patient with slightly improved ability to relax enough to allow PT to support head in supine while palpating cervical spine and upper trap. Patient reports ttp along right side of neck and upper trap but no increased tone or muscle density identified as would be expected given patient's reported pain level and degree of guarding. HEP exercises performed in front of mirror to avoid use of substitution and promote symmetrical movement patterns, with improved cervical ROM noted although right arm still moving less than left during shouder and scapular exercises.   PT Next Visit Plan manual and modalities PRN for pain; postural education and training; cervical and shoulder ROM   Consulted and Agree with Plan of Care Patient        Problem List Patient Active Problem List   Diagnosis Date Noted  . Neck injury 04/23/2015  . Neck pain 06/13/2011  . Back pain 06/13/2011  . Tension headache 06/13/2011    Marry GuanJoAnne M Kreis, PT, MPT 05/07/2015, 11:42 AM  [facility] [address]  [address] [place], [place], [address] Phone: [phone]   Fax:  [phone]  Name: Edward Higgins MRN: [id number] Date of Birth: 11-Feb-1961

## 2015-05-08 ENCOUNTER — Ambulatory Visit: Payer: No Typology Code available for payment source | Admitting: Physical Therapy

## 2015-05-08 DIAGNOSIS — M542 Cervicalgia: Secondary | ICD-10-CM | POA: Diagnosis not present

## 2015-05-08 DIAGNOSIS — M436 Torticollis: Secondary | ICD-10-CM

## 2015-05-08 NOTE — Therapy (Signed)
[facility]patient [facility] [address]  [address] [place], [place], [address] Phone: [phone]   Fax:  [phone]  Physical Therapy Treatment  Patient Details  Name: Edward Higgins MRN: [id number] Date of Birth: 03/29/1961 Referring Provider: Lenda Kelp, MD  Encounter Date: 05/08/2015      PT End of Session - 05/08/15 1025    Visit Number 5   Number of Visits 12   Date for PT Re-Evaluation 06/06/15   PT Start Time 1015   PT Stop Time 1101   PT Time Calculation (min) 46 min   Activity Tolerance Patient limited by pain   Behavior During Therapy Anxious      No past medical history on file.  Past Surgical History  Procedure Laterality Date  . Cystectomy      mid back    There were no vitals filed for this visit.  Visit Diagnosis:  Neck pain  Neck stiffness      Subjective Assessment - 05/08/15 1023    Subjective Patient reports attempting HEP in front of mirror yesterday and felt like it helped to be able to see how he was moving.   Currently in Pain? Yes   Pain Score 5    Pain Location Neck  & upper shoulder   Pain Orientation Right;Lateral           Today's Treatment  Pre-mod estim (10-20Hz ) to right C/S and upper trap x10" with moist heat to promote decreased pain and muscle relaxation in prep for exercises  Manual Supine Gentle C/S distraction with slight flexion  Supine Gentle PROM of C/S in all directions to patient tolerance - Patient with increased difficulty relaxing today, so ROM very limited  TherEx Seated in front of mirror:  Cervical retraction 5x3" (minimal movement)  Supported cervical extension with hand towel at lower C/S 5x3"  B cervical rotation 5x3" each  B cervical sidebending 5x3"  Shoulder shrugs x10 (emphasis on symmetrical movement R vs L)  Shoulder rolls Fwd/Back x10 (emphasis on symmetrical movement R vs L)  Scapular retraction 10x3" (emphasis on symmetrical  movement R vs L) Corner stretch 3x20" Scapular retraction/rows with red TB 10x5"            PT Education - 05/07/15 1122    Education provided Yes   Education Details Postural awareness; Initial HEP (patient instructed to perform exercises in front of mirror to avoid substitution and ensure symmetrical movement)   Person(s) Educated Patient   Methods Explanation;Demonstration;Handout   Comprehension Verbalized understanding;Returned demonstration;Verbal cues required;Tactile cues required;Need further instruction          PT Short Term Goals - 05/07/15 1140    PT SHORT TERM GOAL #1   Title Pt independent with initial HEP (05/09/15)   Status On-going           PT Long Term Goals - 05/07/15 1140    PT LONG TERM GOAL #1   Title Pt independent with advanced HEP vs gym program (06/06/15)   Status On-going   PT LONG TERM GOAL #2   Title Pt will demonstrate C-spine B rotation to 50 degrees or greater, B side-bending to 30 degrees, Flexion & Extension to 50 degrees or greater (06/06/15)   Status On-going   PT LONG TERM GOAL #3   Title Pt will demonstrate bilateral shoulder ROM WFL (06/06/15)   Status On-going   PT LONG TERM GOAL #4   Title Pt reports able to sleep without  restriction by neck pain (06/06/15)   Status On-going   PT LONG TERM GOAL #5   Title Pt reports able to perform all chores, ADLs, and work/volunteer duties without restriction by neck pain (06/06/15)   Status On-going               Plan - 05/08/15 1249    Clinical Impression Statement Posture improved with less left shoulder elevation and more midline head position today but remains very tense and guarded. Increased difficulty relaxing today with poor tolerance for manual therapy and PROM, therefore focused on postural training and gentle AROM.   PT Next Visit Plan manual and modalities PRN for pain; postural education and training; cervical and shoulder ROM        Problem List Patient Active  Problem List   Diagnosis Date Noted  . Neck injury 04/23/2015  . Neck pain 06/13/2011  . Back pain 06/13/2011  . Tension headache 06/13/2011    Edward Higgins, PT, MPT 05/08/2015, 12:54 PM  [facility] [address]  [address] [place], [place], [address] Phone: [phone]   Fax:  [phone]  Name: Edward RuedVictor L Higgins MRN: [id number] Date of Birth: Aug 01, 1960

## 2015-05-10 ENCOUNTER — Encounter: Payer: Self-pay | Admitting: Family Medicine

## 2015-05-10 ENCOUNTER — Ambulatory Visit (INDEPENDENT_AMBULATORY_CARE_PROVIDER_SITE_OTHER): Payer: Self-pay | Admitting: Family Medicine

## 2015-05-10 VITALS — BP 138/86 | HR 64 | Ht 74.0 in | Wt 189.0 lb

## 2015-05-10 DIAGNOSIS — S199XXD Unspecified injury of neck, subsequent encounter: Secondary | ICD-10-CM

## 2015-05-10 MED ORDER — DICLOFENAC SODIUM 75 MG PO TBEC: 75.0000 mg | DELAYED_RELEASE_TABLET | Freq: Two times a day (BID) | ORAL | Status: AC

## 2015-05-10 MED ORDER — METHOCARBAMOL 500 MG PO TABS: 500.0000 mg | ORAL_TABLET | Freq: Three times a day (TID) | ORAL | Status: AC | PRN

## 2015-05-10 MED ORDER — TRAMADOL HCL 50 MG PO TABS: 50.0000 mg | ORAL_TABLET | Freq: Four times a day (QID) | ORAL | Status: AC | PRN
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
## 2015-05-13 ENCOUNTER — Encounter: Payer: Self-pay | Admitting: Rehabilitation

## 2015-05-13 ENCOUNTER — Ambulatory Visit: Payer: No Typology Code available for payment source | Admitting: Rehabilitation

## 2015-05-13 DIAGNOSIS — M542 Cervicalgia: Secondary | ICD-10-CM | POA: Diagnosis not present

## 2015-05-13 DIAGNOSIS — M436 Torticollis: Secondary | ICD-10-CM

## 2015-05-13 NOTE — Therapy (Signed)
[facility]patient [facility] [address]  [address] [place], [place], [address] Phone: [phone]   Fax:  [phone]  Physical Therapy Treatment  Patient Details  Name: Edward Higgins MRN: [id number] Date of Birth: 1960-07-07 Referring Provider: Lenda Kelp, MD  Encounter Date: 05/13/2015      PT End of Session - 05/13/15 0930    Visit Number 6   Number of Visits 12   Date for PT Re-Evaluation 06/06/15   PT Start Time 0850   PT Stop Time 0931   PT Time Calculation (min) 41 min   Activity Tolerance Patient tolerated treatment well      History reviewed. No pertinent past medical history.  Past Surgical History  Procedure Laterality Date  . Cystectomy      mid back    There were no vitals filed for this visit.  Visit Diagnosis:  Neck pain  Neck stiffness      Subjective Assessment - 05/13/15 0854    Subjective Sore today.  Reports therapy does help.   Saw Dr. Pearletha Forge on Friday who added 3 new medications per medications section and told him to continue with what he was doing in therapy.     Currently in Pain? Yes   Pain Score 5    Pain Location Neck  R UT   Pain Descriptors / Indicators Tightness;Throbbing   Pain Type Acute pain   Aggravating Factors  movement of neck   Pain Relieving Factors heat      Today's Treatment  Pre-mod estim (10-20Hz ) to right C/S and upper trap x10" with moist heat to promote decreased pain and muscle relaxation in prep for exercises  Manual  Supine Gentle PROM of C/S in all directions to patient tolerance STM R UT, scalenes, SCM, suboccipitals with gentle attempts at suboccipital release not tolerated well  TherEx Seated in front of mirror:     B cervical rotation 5x3" each with manual hold of shoulders; minimal movement R  B cervical sidebending 5x3"  Shoulder shrugs x10 (emphasis on symmetrical movement R vs L)  Shoulder rolls Fwd/Back x10 (emphasis on symmetrical  movement R vs L)  Scapular retraction 10x3" (emphasis on symmetrical movement R vs L)   Shoulder elevation alternating x 5   Seated rhomboid stretch x 20"    Seated cross body stretch 2x20"  Scapular retraction/rows with red TB 10x5"                              PT Short Term Goals - 05/07/15 1140    PT SHORT TERM GOAL #1   Title Pt independent with initial HEP (05/09/15)   Status On-going           PT Long Term Goals - 05/07/15 1140    PT LONG TERM GOAL #1   Title Pt independent with advanced HEP vs gym program (06/06/15)   Status On-going   PT LONG TERM GOAL #2   Title Pt will demonstrate C-spine B rotation to 50 degrees or greater, B side-bending to 30 degrees, Flexion & Extension to 50 degrees or greater (06/06/15)   Status On-going   PT LONG TERM GOAL #3   Title Pt will demonstrate bilateral shoulder ROM WFL (06/06/15)   Status On-going   PT LONG TERM GOAL #4   Title Pt reports able to sleep without restriction by neck pain (06/06/15)   Status On-going   PT  LONG TERM GOAL #5   Title Pt reports able to perform all chores, ADLs, and work/volunteer duties without restriction by neck pain (06/06/15)   Status On-going               Plan - 05/13/15 0931    Clinical Impression Statement very guarded and stiff onthe R  side with all UE and neck movements. Also very low tolerance to STM and attempted suboccipital work.  +3 ttp R suboccipitals.  Did improve rotation to the R from 4deg to 15 pre/post treatment.  Also sig education on importance of decreasing guarding to improve pain.     PT Next Visit Plan manual and modalities PRN for pain; postural education and training; cervical and shoulder ROM        Problem List Patient Active Problem List   Diagnosis Date Noted  . Neck injury 04/23/2015  . Neck pain 06/13/2011  . Back pain 06/13/2011  . Tension headache 06/13/2011    Idamae Lusherevis, Taksh Hjort R, DPT, CMP 05/13/2015, 9:37 AM  [facility] [place] [address]  [address] [place], [place], [address] Phone: [phone]   Fax:  [phone]  Name: Edward Higgins MRN: [id number] Date of Birth: November 10, 1960
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
## 2015-05-15 ENCOUNTER — Ambulatory Visit: Payer: No Typology Code available for payment source | Admitting: Physical Therapy

## 2015-05-15 DIAGNOSIS — M542 Cervicalgia: Secondary | ICD-10-CM | POA: Diagnosis not present

## 2015-05-15 DIAGNOSIS — M436 Torticollis: Secondary | ICD-10-CM

## 2015-05-15 NOTE — Therapy (Signed)
[facility] [address]  [address] [place], [place], [address] Phone: [phone]   Fax:  [phone]  Physical Therapy Treatment  Patient Details  Name: Edward Higgins MRN: [id number] Date of Birth: [birth year] Referring Provider: Lenda Kelp, MD  Encounter Date: 05/15/2015      PT End of Session - 05/15/15 0941    Visit Number 7   Number of Visits 12   Date for PT Re-Evaluation 06/06/15   PT Start Time 0934   PT Stop Time 1018   PT Time Calculation (min) 44 min   Activity Tolerance Patient tolerated treatment well   Behavior During Therapy Anxious      No past medical history on file.  Past Surgical History  Procedure Laterality Date  . Cystectomy      mid back    There were no vitals filed for this visit.  Visit Diagnosis:  Neck pain  Neck stiffness      Subjective Assessment - 05/15/15 0938    Subjective Feels like he's doing better. Thinks new meds MD started last week are helping and has been soaking in a hot tub.   Currently in Pain? Yes   Pain Score 5    Pain Location Neck   Pain Orientation Lateral            [facility] PT Assessment - 05/15/15 0934    Assessment   Next MD Visit 06/27/15          Today's Treatment  TherEx UBE - lvl 1 fwd/back 1' each Seated in front of mirror:  Cervical retraction 10x3"   B cervical rotation 10x3" each with manual hold of shoulders; gradually improving movement on R  B cervical sidebending 5x3"  Shoulder shrugs x10 (emphasis on symmetrical movement R vs L)  Shoulder elevation alternating x 10   Shoulder rolls Fwd/Back x10 (emphasis on symmetrical movement R vs L)  Scapular retraction 10x3" (emphasis on symmetrical movement R vs L)  Seated rhomboid stretch 2x20"   Seated cross body stretch 2x20"  Corner stretch 3x20" Supine horizontal abduction with yellow TB 10x3" Scapular retraction/rows with red TB 10x5"  Manual  Supine  Gentle PROM of C/S in all directions to patient tolerance STM R UT, scalenes, SCM, suboccipitals with gentle attempts at suboccipital release - better tolerated when head positioned on folded thin pillow than when held in PT's hands           PT Short Term Goals - 05/15/15 1127    PT SHORT TERM GOAL #1   Title Pt independent with initial HEP (05/09/15)   Status Achieved           PT Long Term Goals - 05/15/15 1127    PT LONG TERM GOAL #1   Title Pt independent with advanced HEP vs gym program (06/06/15)   Status On-going   PT LONG TERM GOAL #2   Title Pt will demonstrate C-spine B rotation to 50 degrees or greater, B side-bending to 30 degrees, Flexion & Extension to 50 degrees or greater (06/06/15)   Status On-going   PT LONG TERM GOAL #3   Title Pt will demonstrate bilateral shoulder ROM WFL (06/06/15)   Status On-going   PT LONG TERM GOAL #4   Title Pt reports able to sleep without restriction by neck pain (06/06/15)   Status On-going   PT LONG TERM GOAL #5   Title Pt reports able to perform all chores, ADLs, and  work/volunteer duties without restriction by neck pain (06/06/15)   Status On-going               Plan - 05/15/15 1011    Clinical Impression Statement Patient continues to demonstrate significant guarding on right with increasing tightness noted in pectoralis muscles today. Able to better relax in supine if head placed on pillow rather than held in therapist's hands allowing for slightly improved tolerance for STM but still significant guarding with PROM attempts. AROM into R rotation in sitting slowly improving. Estim deferred today at patient request "to see how he does without it"..   PT Next Visit Plan manual and modalities PRN for pain; postural education and training; cervical and shoulder ROM (wand exerciese?); try hooklying on 1/2 foam roll?   Consulted and Agree with Plan of Care Patient        Problem List Patient Active Problem List    Diagnosis Date Noted  . Neck injury 04/23/2015  . Neck pain 06/13/2011  . Back pain 06/13/2011  . Tension headache 06/13/2011    Marry GuanJoAnne M Jamar Weatherall, PT, MPT 05/15/2015, 11:29 AM  [facility] [place] [facility] [address]  [address] [place], [place], [address] Phone: [phone]   Fax:  [phone]  Name: Edward Higgins MRN: [id number] Date of Birth: [birth year]

## 2015-05-21 ENCOUNTER — Ambulatory Visit: Payer: No Typology Code available for payment source | Admitting: Physical Therapy

## 2015-05-23 ENCOUNTER — Ambulatory Visit: Payer: No Typology Code available for payment source | Admitting: Physical Therapy

## 2015-05-23 DIAGNOSIS — M542 Cervicalgia: Secondary | ICD-10-CM

## 2015-05-23 DIAGNOSIS — M436 Torticollis: Secondary | ICD-10-CM

## 2015-05-23 NOTE — Therapy (Signed)
[facility] [address]  [address] [place], [place], [address] Phone: [phone]   Fax:  [phone]  Physical Therapy Treatment  Patient Details  Name: Edward Higgins MRN: [id number] Date of Birth: [birth year] Referring Provider: Lenda Kelp, MD  Encounter Date: 05/23/2015      PT End of Session - 05/23/15 0902    Visit Number 8   Number of Visits 12   Date for PT Re-Evaluation 06/06/15   PT Start Time 0857  Patient arrived late   PT Stop Time 0935   PT Time Calculation (min) 38 min   Activity Tolerance Patient limited by pain   Behavior During Therapy Anxious      No past medical history on file.  Past Surgical History  Procedure Laterality Date  . Cystectomy      mid back    There were no vitals filed for this visit.  Visit Diagnosis:  Neck pain  Neck stiffness      Subjective Assessment - 05/23/15 0859    Subjective "I think I'm doing a little better" but hten states "I'm more sore today."   Patient Stated Goals To get the pain down.   Currently in Pain? Yes   Pain Score --  5-6/10   Pain Location Neck   Pain Orientation Right;Lateral   Pain Descriptors / Indicators Sore           Today's Treatment  TherEx Hooklying:   Bil shoulder flexion pullover with wand x10 (very limited ROM d/t guarding)   Wand chest press x10   Horizontal abduction with yellow TB 10x3" Corner stretch 3x20" Scapular retraction/rows with red TB 10x5"  Manual  Supine Gentle PROM of C/S in all directions to patient tolerance Gentle contract/relax at end range sidebending and rotation to increase ROM STM R UT, levator, scalenes, SCM, suboccipitals with gentle attempts at suboccipital release - better tolerated when head positioned on folded thin pillow than when held in PT's hands TRP to right R UT & levator           PT Short Term Goals - 05/15/15 1127    PT SHORT TERM GOAL #1   Title Pt  independent with initial HEP (05/09/15)   Status Achieved           PT Long Term Goals - 05/23/15 9518    PT LONG TERM GOAL #1   Title Pt independent with advanced HEP vs gym program (06/06/15)   Status On-going   PT LONG TERM GOAL #2   Title Pt will demonstrate C-spine B rotation to 50 degrees or greater, B side-bending to 30 degrees, Flexion & Extension to 50 degrees or greater (06/06/15)   Status On-going   PT LONG TERM GOAL #3   Title Pt will demonstrate bilateral shoulder ROM WFL (06/06/15)   Status On-going   PT LONG TERM GOAL #4   Title Pt reports able to sleep without restriction by neck pain (06/06/15)   Status On-going   PT LONG TERM GOAL #5   Title Pt reports able to perform all chores, ADLs, and work/volunteer duties without restriction by neck pain (06/06/15)   Status On-going               Plan - 05/23/15 0953    Clinical Impression Statement Patient inconsistent with reports of improvement as well as performance during therapy, reporting increased movement of neck and demonstrating increased movement during conversation but then guarding significant  and exceedingly slow movement while attempting to perform exercises as part of therapy. Improved shoulder alignment noted but continues to maintain neck in left lateral flexion and rotation, often with left hand reaching over holding onto to right side of neck.   PT Next Visit Plan manual and modalities PRN for pain; postural education and training; cervical and shoulder ROM; try hooklying on 1/2 foam roll?   Consulted and Agree with Plan of Care Patient        Problem List Patient Active Problem List   Diagnosis Date Noted  . Neck injury 04/23/2015  . Neck pain 06/13/2011  . Back pain 06/13/2011  . Tension headache 06/13/2011    Marry GuanJoAnne M Marino Rogerson, PT, MPT 05/23/2015, 10:22 AM  [facility] [address]  [address] [place], [place], [address] Phone:  [phone]   Fax:  [phone]  Name: Edward Higgins MRN: [id number] Date of Birth: 1960/08/30

## 2015-05-28 ENCOUNTER — Ambulatory Visit: Payer: No Typology Code available for payment source | Attending: Family Medicine | Admitting: Physical Therapy

## 2015-05-28 DIAGNOSIS — M542 Cervicalgia: Secondary | ICD-10-CM | POA: Diagnosis not present

## 2015-05-28 DIAGNOSIS — M436 Torticollis: Secondary | ICD-10-CM

## 2015-05-28 NOTE — Therapy (Signed)
[facility] [address]  [address] [place], [place], [address] Phone: [phone]   Fax:  [phone]  Physical Therapy Treatment  Patient Details  Name: Edward Higgins MRN: [id number] Date of Birth: 1960-09-10 Referring Provider: Lenda Kelp, MD  Encounter Date: 05/28/2015      PT End of Session - 05/28/15 0938    Visit Number 9   Number of Visits 12   Date for PT Re-Evaluation 06/06/15   PT Start Time 0931   PT Stop Time 1018   PT Time Calculation (min) 47 min   Activity Tolerance Patient limited by pain   Behavior During Therapy Anxious      No past medical history on file.  Past Surgical History  Procedure Laterality Date  . Cystectomy      mid back    There were no vitals filed for this visit.  Visit Diagnosis:  Neck pain  Neck stiffness      Subjective Assessment - 05/28/15 0935    Subjective "It's feeling a little bit better than last week. I feel like I have more motion." States he tried to stay off hte medicine for a few days because "it tastes so bad" but had to take some this morning "because the nerve was throbbing."   Currently in Pain? Yes   Pain Score --  5.5/10   Pain Location Neck   Pain Orientation Right;Lateral   Pain Descriptors / Indicators Throbbing            [facility] PT Assessment - 05/28/15 0931    ROM / Strength   AROM / PROM / Strength AROM;Strength   AROM   AROM Assessment Site Cervical;Shoulder   Right/Left Shoulder Right;Left   Right Shoulder Flexion 92 Degrees   Right Shoulder ABduction 62 Degrees   Left Shoulder Flexion 111 Degrees   Left Shoulder ABduction 86 Degrees  pain in biceps   Cervical Flexion 20   Cervical Extension 3   Cervical - Right Side Bend 15   Cervical - Left Side Bend 22   Cervical - Right Rotation 21   Cervical - Left Rotation 22   Strength   Strength Assessment Site Shoulder   Right/Left Shoulder Right;Left   Right Shoulder Flexion  4-/5   Right Shoulder ABduction 4-/5   Right Shoulder Internal Rotation 4/5   Right Shoulder External Rotation 4-/5   Left Shoulder Flexion 4/5   Left Shoulder ABduction 4/5   Left Shoulder Internal Rotation 4/5          Today's Treatment  TherEx Hooklying on 1/2 foam roll:  Chest/pec stretch with arms at ~70 degrees abduction x2'   Horizontal abduction with yellow TB 10x3"   Bil shoulder flexion pullover with wand x10 (very limited ROM d/t guarding)  Wand chest press x10 Seated:   Cervical retraction 10x3"   B cervical rotation 10x3" each with manual hold of shoulders; gradually improving movement on R  B cervical sidebending 5x3"  Manual  Supine Gentle PROM of C/S in all directions to patient tolerance Gentle contract/relax at end range sidebending and rotation to increase ROM STM R UT, levator, scalenes, SCM, suboccipitals with gentle attempts at suboccipital release             PT Short Term Goals - 05/15/15 1127    PT SHORT TERM GOAL #1   Title Pt independent with initial HEP (05/09/15)   Status Achieved  PT Long Term Goals - 05/28/15 1038    PT LONG TERM GOAL #1   Title Pt independent with advanced HEP vs gym program (06/06/15)   Status On-going   PT LONG TERM GOAL #2   Title Pt will demonstrate C-spine B rotation to 50 degrees or greater, B side-bending to 30 degrees, Flexion & Extension to 50 degrees or greater (06/06/15)   Status On-going   PT LONG TERM GOAL #3   Title Pt will demonstrate bilateral shoulder ROM WFL (06/06/15)   Status On-going   PT LONG TERM GOAL #4   Title Pt reports able to sleep without restriction by neck pain (06/06/15)   Status On-going   PT LONG TERM GOAL #5   Title Pt reports able to perform all chores, ADLs, and work/volunteer duties without restriction by neck pain (06/06/15)   Status On-going               Plan - 05/28/15 1039    Clinical Impression Statement Patient continuing to report  perceived improvement, however pain ratings remain the same. Cervical ROM only slightly improved when measured actively, but significantly increased ROM available with PROM and during random movements (PT sitting to patient's right and patient able to turn head to talk to PT). Patient able to acheive better cervical extension when hooklying on 1/2 foam roll. Shoulder ROM also demonstrating only minimal improvement (with left shoulder abduction decreased from eval) during formal ROM assessement but increased ROM noted during therapuetic exercises and positioning for MMT. Overall assessment does not correlate with observed functional movements, but difficult to determine if descrepancies are due to pain/guarding vs patient effort. Limited progression of exercises secondary to patient moving at exceeding slow pace during exercises.   PT Next Visit Plan manual and modalities PRN for pain; postural education and training; cervical and shoulder ROM   Consulted and Agree with Plan of Care Patient        Problem List Patient Active Problem List   Diagnosis Date Noted  . Neck injury 04/23/2015  . Neck pain 06/13/2011  . Back pain 06/13/2011  . Tension headache 06/13/2011    Marry GuanJoAnne M Kreis, PT, MPT 05/28/2015, 12:05 PM  [facility] [address]  [address] [place], [place], [address] Phone: [phone]   Fax:  [phone]  Name: Edward Higgins MRN: [id number] Date of Birth: Mar 06, 1961

## 2015-05-31 ENCOUNTER — Ambulatory Visit: Payer: No Typology Code available for payment source | Admitting: Physical Therapy

## 2015-06-04 ENCOUNTER — Ambulatory Visit: Payer: No Typology Code available for payment source | Admitting: Physical Therapy

## 2015-06-05 ENCOUNTER — Ambulatory Visit: Payer: No Typology Code available for payment source | Admitting: Physical Therapy

## 2015-06-05 DIAGNOSIS — M542 Cervicalgia: Secondary | ICD-10-CM | POA: Diagnosis not present

## 2015-06-05 DIAGNOSIS — M436 Torticollis: Secondary | ICD-10-CM

## 2015-06-05 NOTE — Therapy (Signed)
[facility] [address]  [address] [place], [place], [address] Phone: [phone]   Fax:  [phone]  Physical Therapy Treatment  Patient Details  Name: Edward Higgins MRN: [id number] Date of Birth: July 21, 1960 Referring Provider: Lenda Kelp, MD  Encounter Date: 06/05/2015      PT End of Session - 06/05/15 0938    Visit Number 10   Number of Visits 12   Date for PT Re-Evaluation 06/06/15   PT Start Time 0930   PT Stop Time 1017   PT Time Calculation (min) 47 min   Activity Tolerance Patient tolerated treatment well;Patient limited by pain   Behavior During Therapy [facility] for tasks assessed/performed      No past medical history on file.  Past Surgical History  Procedure Laterality Date  . Cystectomy      mid back    There were no vitals filed for this visit.  Visit Diagnosis:  Neck pain  Neck stiffness      Subjective Assessment - 06/05/15 0934    Subjective "To me, it seems like it's getting better, but it's still stiff." Reports completing "a small amount" of HEP exercises "most days".   Currently in Pain? Yes   Pain Score 5    Pain Location Neck   Pain Orientation Right;Lateral   Pain Descriptors / Indicators Throbbing   Pain Radiating Towards upper shoulder and posterior upper arm            [facility] PT Assessment - 06/05/15 0930    Observation/Other Assessments   Focus on Therapeutic Outcomes (FOTO)  Neck 41% (59% limitation)          Today's Treatment  TherEx Seated with focus on postural awareness:  Cervical retraction 15x3"   B cervical rotation 10x3" each (very limited movement to R)  B cervical sidebending 10x3"   Shoulder rolls Fwd/Back x10 (emphasis on symmetrical movement R vs L)  Seated rhomboid stretch 2x20"   Seated cross body stretch 2x20"  Manual  Supine Gentle PROM of C/S in all directions to patient tolerance Gentle contract/relax at end range  sidebending and rotation to increase ROM STM R UT, levator, scalenes and rhomboids in supine and left sidelying  Kinesiotape:  2 strips to bil paraspinals at 30%  1 horizonal strip at C8-T1 at 70%  2 strips to bil UT at 30%            PT Short Term Goals - 05/15/15 1127    PT SHORT TERM GOAL #1   Title Pt independent with initial HEP (05/09/15)   Status Achieved           PT Long Term Goals - 06/05/15 1052    PT LONG TERM GOAL #1   Title Pt independent with advanced HEP vs gym program (06/06/15)   Status On-going   PT LONG TERM GOAL #2   Title Pt will demonstrate C-spine B rotation to 50 degrees or greater, B side-bending to 30 degrees, Flexion & Extension to 50 degrees or greater (06/06/15)   Status On-going   PT LONG TERM GOAL #3   Title Pt will demonstrate bilateral shoulder ROM WFL (06/06/15)   Status On-going   PT LONG TERM GOAL #4   Title Pt reports able to sleep without restriction by neck pain (06/06/15)   Status On-going   PT LONG TERM GOAL #5   Title Pt reports able to perform all chores, ADLs, and work/volunteer duties without  restriction by neck pain (06/06/15)   Status On-going               Plan - 06/05/15 1053    Clinical Impression Statement Patient continues to lack postural awareness wanting to sit with slouched posture  at all times including during seated exercise attempts, therefore reviewed proper posture and emphasized postural awareness along with avoidance of substitution during ROM exercises. Poor tolerance for attempted shoulder exercises due to c/o pain in UT, therefore increased emphasis on manual therapy and STM and initiated trial of kinesiotaping to reduce muscle strain and pain.   PT Next Visit Plan Assess response to taping, manual and modalities PRN for pain; postural education and training; cervical and shoulder ROM, taping if benefit noted   Consulted and Agree with Plan of Care Patient        Problem List Patient Active  Problem List   Diagnosis Date Noted  . Neck injury 04/23/2015  . Neck pain 06/13/2011  . Back pain 06/13/2011  . Tension headache 06/13/2011    Marry GuanJoAnne M Kreis, PT, MPT 06/05/2015, 11:35 AM  [facility] [place] [facility] [address]  [address] [place], [place], [address] Phone: [phone]   Fax:  [phone]  Name: Edward RuedVictor L Higgins MRN: [id number] Date of Birth: [birth year]

## 2015-06-12 ENCOUNTER — Ambulatory Visit: Payer: No Typology Code available for payment source | Admitting: Physical Therapy

## 2015-06-12 DIAGNOSIS — M542 Cervicalgia: Secondary | ICD-10-CM

## 2015-06-12 DIAGNOSIS — M436 Torticollis: Secondary | ICD-10-CM

## 2015-06-12 NOTE — Therapy (Addendum)
[facility] [address]  [place], [place], [address] Phone: [phone]   Fax:  [phone]  Physical Therapy Treatment  Patient Details  Name: Edward Higgins MRN: [id number] Date of Birth: Jan 25, 1961 Referring Provider: Dene Gentry, MD  Encounter Date: 06/12/2015      PT End of Session - 06/12/15 0935    Visit Number 11   Number of Visits 12   Date for PT Re-Evaluation 06/17/15   PT Start Time 0930   PT Stop Time 1018   PT Time Calculation (min) 48 min   Activity Tolerance Patient tolerated treatment well   Behavior During Therapy [facility] for tasks assessed/performed      No past medical history on file.  Past Surgical History  Procedure Laterality Date  . Cystectomy      mid back    There were no vitals filed for this visit.  Visit Diagnosis:  Neck pain  Neck stiffness      Subjective Assessment - 06/12/15 0934    Subjective Patient reporting he felt like the taping helped the day it was applied but took it off the next day. Continues to report perceived improvement.   Currently in Pain? Yes   Pain Score 5    Pain Location Neck   Pain Orientation Right;Lateral           Today's Treatment  TherEx Hooklying on 1/2 foam roll:  Chest/pec stretch with arms at ~70 degrees abduction x2'  Horizontal abduction with yellow TB 10x3"  Bil shoulder flexion pullover with wand 3# x10 (limited ROM d/t guarding)  Wand chest press 3# x10 Seated with focus on postural awareness (PT's hands on shoulders to limit trunk motion):  Cervical retraction 15x3"   B cervical rotation 10x3" each (very limited movement to R)  B cervical sidebending 10x3"    Manual  Supine Gentle PROM of C/S in all directions to patient tolerance Gentle contract/relax at end range sidebending and rotation to increase ROM STM R UT, levator, scalenes and rhomboids in supine and left sidelying            PT  Short Term Goals - 05/15/15 1127    PT SHORT TERM GOAL #1   Title Pt independent with initial HEP (05/09/15)   Status Achieved           PT Long Term Goals - 06/12/15 1021    PT LONG TERM GOAL #1   Title Pt independent with advanced HEP vs gym program (06/06/15)   Status On-going   PT LONG TERM GOAL #2   Title Pt will demonstrate C-spine B rotation to 50 degrees or greater, B side-bending to 30 degrees, Flexion & Extension to 50 degrees or greater (06/06/15)   Status On-going   PT LONG TERM GOAL #3   Title Pt will demonstrate bilateral shoulder ROM WFL (06/06/15)   Status On-going   PT LONG TERM GOAL #4   Title Pt reports able to sleep without restriction by neck pain (06/06/15)   Status On-going               Plan - 06/12/15 1005    Clinical Impression Statement Patient continues to verbalize perceived improvement but pain reports remain unchanged and continued guarding and substitution present with all neck and shoulder movements. Resistant to/difficulty relaxing with manual therapy and PROM but able to achieve near full PROM of cervical spine when able to distract patient. Will reassess at  next visit to determine if patient has potential to benefit from further therapy and recert if indicated.   PT Next Visit Plan Assess ROM and progress towards goals - recert vs D/C   Consulted and Agree with Plan of Care Patient        Problem List Patient Active Problem List   Diagnosis Date Noted  . Neck injury 04/23/2015  . Neck pain 06/13/2011  . Back pain 06/13/2011  . Tension headache 06/13/2011    Percival Spanish, PT, MPT 06/12/2015, 10:23 AM  [facility] [address]  [place], [place], [address] Phone: [phone]   Fax:  [phone]  Name: Edward Higgins MRN: [id number] Date of Birth: Jul 08, 1960   PHYSICAL THERAPY DISCHARGE SUMMARY  Visits from Start of Care: 11  Current functional level  related to goals / functional outcomes:  Patient being discharged secondary to > 3 NS/Cx with no communication received from patient. Unable to assess status at discharge due to failure to return to PT, but refer to above note for status as of most recent visit.   Remaining deficits:  Unable to assess   Education / Equipment:  HEP  Plan: Patient agrees to discharge.  Patient goals were not met. Patient is being discharged due to not returning since the last visit.  ?????       Percival Spanish, PT, MPT 07/04/2015, 5:21 PM  [facility] [address]  [place], [place], [address] Phone: [phone]   Fax:  [phone]

## 2015-06-17 ENCOUNTER — Ambulatory Visit: Payer: No Typology Code available for payment source | Admitting: Physical Therapy

## 2015-06-27 ENCOUNTER — Ambulatory Visit (INDEPENDENT_AMBULATORY_CARE_PROVIDER_SITE_OTHER): Payer: Self-pay | Admitting: Family Medicine

## 2015-06-27 ENCOUNTER — Encounter: Payer: Self-pay | Admitting: Family Medicine

## 2015-06-27 VITALS — BP 135/91 | HR 74 | Ht 74.0 in | Wt 190.0 lb

## 2015-06-27 DIAGNOSIS — S199XXD Unspecified injury of neck, subsequent encounter: Secondary | ICD-10-CM

## 2015-06-27 NOTE — Patient Instructions (Signed)
You have a severe cervical strain. Continue voltaren  twice a day with food instead of aleve/ibuprofen products. Robaxin as needed for muscle spasms. Call the pharmacy if you need refills of these (or we can send them in now if you need them, just let us know). Use cervical collar if severely painful. Simple range of motion exercises within limits of pain to prevent further stiffness. Continue physical therapy for stretching, exercises, traction, and modalities. Heat 15 minutes at a time 3-4 times a day to help with spasms. Watch head position when on computers, texting, when sleeping in bed - should in line with back to prevent further nerve traction and irritation. Follow up with me in 6 weeks otherwise. Consider an injection if not improving by [facility] (we would arrange).
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
## 2015-08-08 ENCOUNTER — Ambulatory Visit: Payer: Self-pay | Admitting: Family Medicine

## 2020-06-03 ENCOUNTER — Emergency Department: Payer: No Typology Code available for payment source

## 2020-06-03 ENCOUNTER — Emergency Department
Admission: EM | Admit: 2020-06-03 | Discharge: 2020-06-03 | Discharge status: Against Medical Advice | Payer: No Typology Code available for payment source | Attending: Emergency Medicine | Admitting: Emergency Medicine

## 2020-06-03 ENCOUNTER — Encounter: Payer: Self-pay | Admitting: Emergency Medicine

## 2020-06-03 ENCOUNTER — Other Ambulatory Visit: Payer: Self-pay

## 2020-06-03 DIAGNOSIS — Z79899 Other long term (current) drug therapy: Secondary | ICD-10-CM | POA: Diagnosis not present

## 2020-06-03 DIAGNOSIS — R0789 Other chest pain: Secondary | ICD-10-CM | POA: Diagnosis not present

## 2020-06-03 LAB — CBC
HCT: 42.2 % (ref 39.0–52.0)
Hemoglobin: 14.2 g/dL (ref 13.0–17.0)
MCH: 30.4 pg (ref 26.0–34.0)
MCHC: 33.6 g/dL (ref 30.0–36.0)
MCV: 90.4 fL (ref 80.0–100.0)
Platelets: 244 10*3/uL (ref 150–400)
RBC: 4.67 MIL/uL (ref 4.22–5.81)
RDW: 13.6 % (ref 11.5–15.5)
WBC: 11 10*3/uL — ABNORMAL HIGH (ref 4.0–10.5)
nRBC: 0 % (ref 0.0–0.2)

## 2020-06-03 LAB — BASIC METABOLIC PANEL
Anion gap: 11 (ref 5–15)
BUN: 18 mg/dL (ref 8–23)
CO2: 28 mmol/L (ref 22–32)
Calcium: 9.3 mg/dL (ref 8.9–10.3)
Chloride: 98 mmol/L (ref 98–111)
Creatinine, Ser: 1.21 mg/dL (ref 0.61–1.24)
GFR, Estimated: >60 mL/min (ref >60)
Glucose, Bld: 198 mg/dL — ABNORMAL HIGH (ref 70–99)
Potassium: 3.8 mmol/L (ref 3.5–5.1)
Sodium: 137 mmol/L (ref 135–145)

## 2020-06-03 LAB — TROPONIN I (HIGH SENSITIVITY): Troponin I (High Sensitivity): 6 ng/L (ref <18)

## 2020-06-03 MED ORDER — IBUPROFEN 400 MG PO TABS
400.0000 mg | ORAL_TABLET | Freq: Once | ORAL | Status: DC
  Filled 2020-06-03: qty 1

## 2020-06-03 MED ORDER — ACETAMINOPHEN 500 MG PO TABS
1000.0000 mg | ORAL_TABLET | Freq: Once | ORAL | Status: AC
  Administered 2020-06-03: 1000 mg via ORAL
  Filled 2020-06-03: qty 2
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
## 2020-06-03 NOTE — ED Notes (Signed)
Dr .Katrinka Blazing in speaking with patient, pt agreeing to leave AMA. Pt calm cooperative signed AMA form .    " going to [place]"

## 2020-06-03 NOTE — Discharge Instructions (Signed)
Please take Tylenol and ibuprofen/Advil for your pain.  It is safe to take them together, or to alternate them every few hours.  Take up to 1000mg of Tylenol at a time, up to 4 times per day.  Do not take more than 4000 mg of Tylenol in 24 hours.  For ibuprofen, take 400-600 mg, 4-5 times per day. ° ° °

## 2020-06-03 NOTE — ED Triage Notes (Signed)
Patient with complaint of left side chest pain with nausea and vomiting that started about 2 hours ago. Patient denies shortness of breath.

## 2020-06-03 NOTE — ED Notes (Signed)
Was going to draw second troponin and explained reason--to check for heart attack.  He says his nerves are all torn up and cannot do it.  Says he is going back to [place] when he leaves here because he knows there is something wrong.  MD informed of refusal.

## 2021-09-30 IMAGING — CR DG CHEST 2V
2 series · 2 of 2 positions shown · non-contrast
Comparison: None

CLINICAL DATA: Chest pain

EXAM:
CHEST - 2 VIEW

[chest lat]
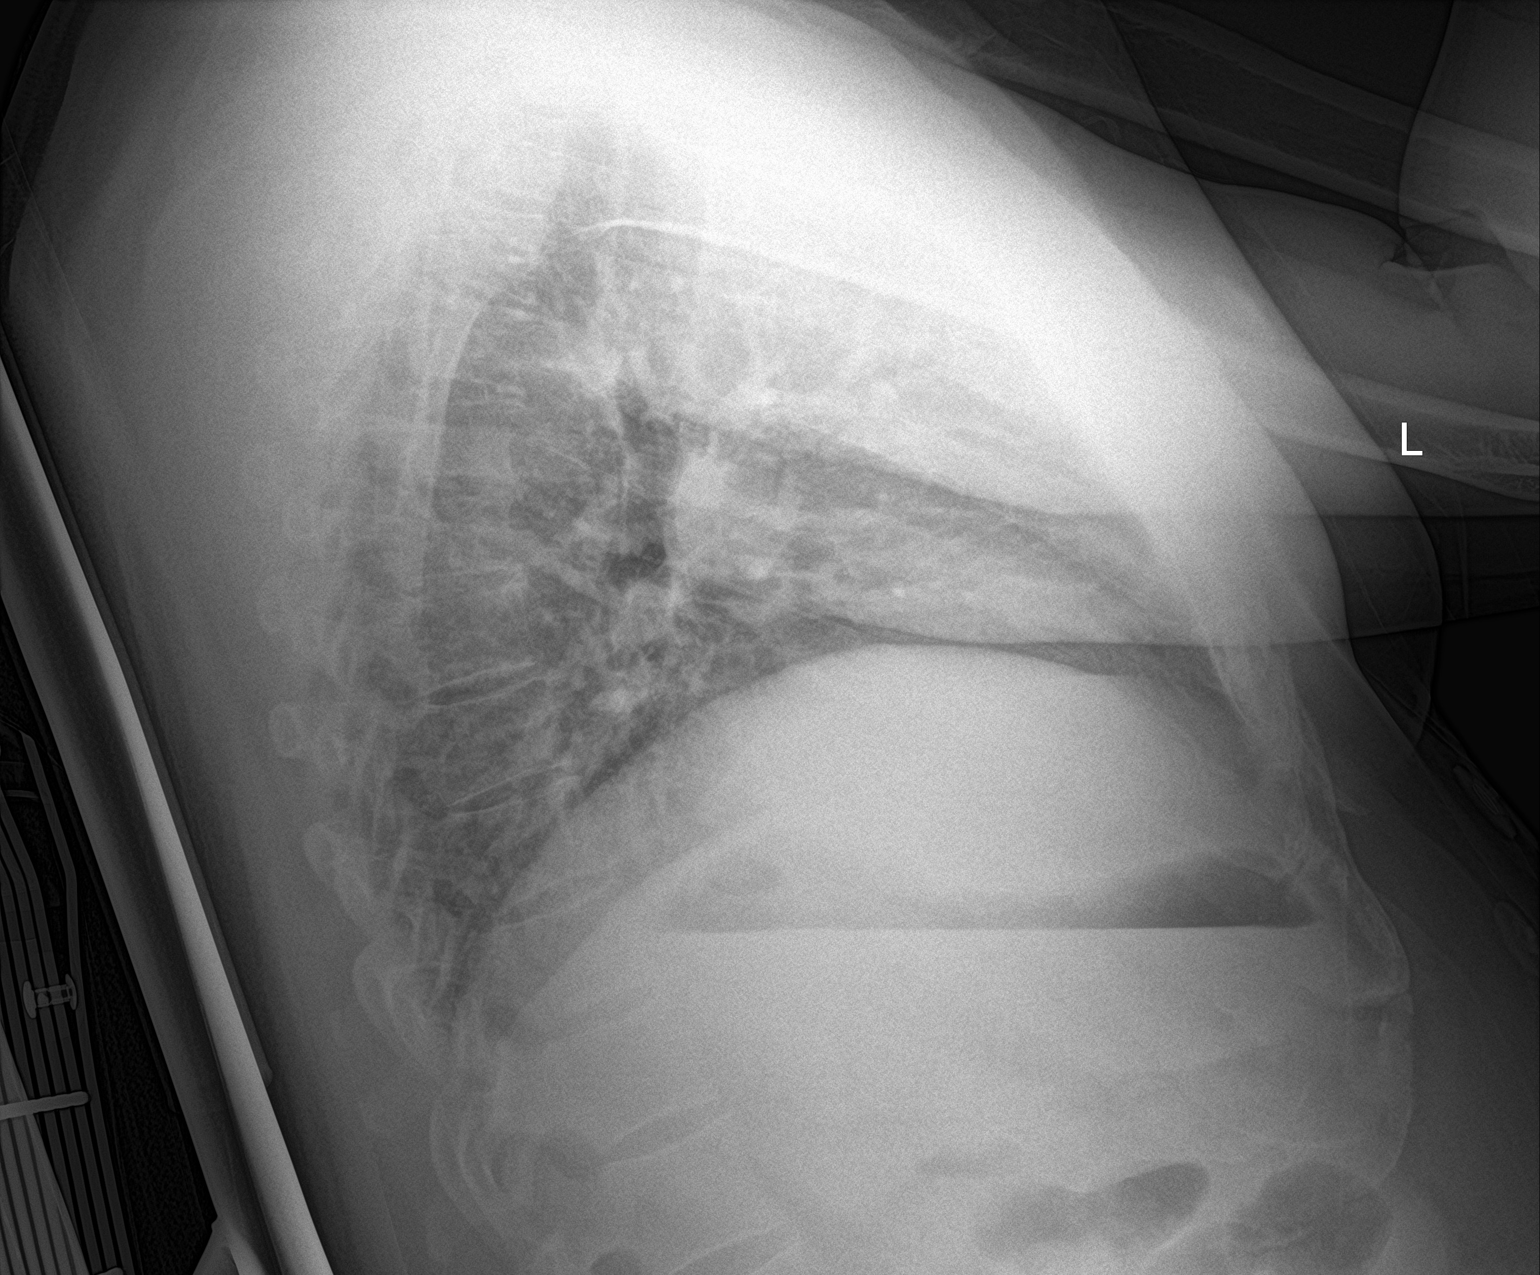

[chest ap]
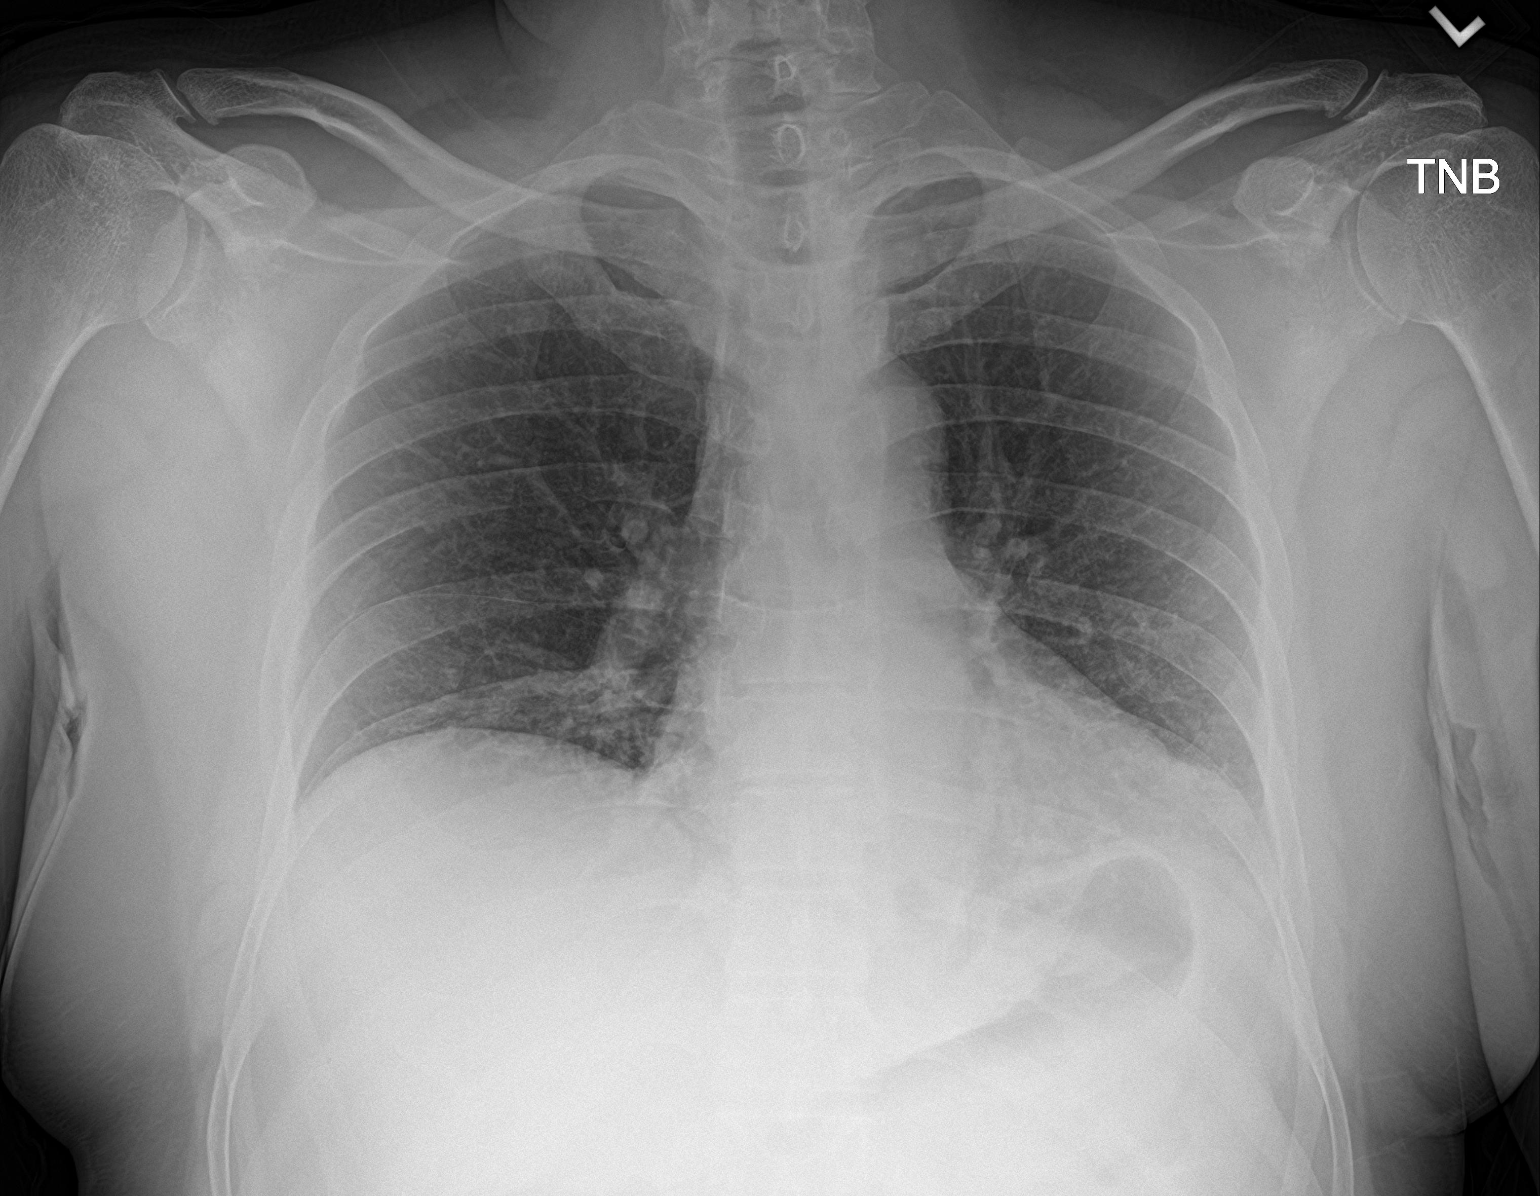

[2 of 2 positions shown; findings below may reference images not displayed]

FINDINGS: The heart size and mediastinal contours are within normal limits.
Both lungs are clear. The visualized skeletal structures are
unremarkable.
IMPRESSION: No active cardiopulmonary disease.
